# Patient Record
Sex: Female | Born: 2000 | Race: Black or African American | Hispanic: No | Marital: Married | Smoking: Never smoker
Health system: Southern US, Community
[De-identification: ages and names within clinical notes are randomized; demographics above are authoritative.]

---
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
## 2019-05-23 ENCOUNTER — Other Ambulatory Visit: Payer: Self-pay

## 2019-05-23 DIAGNOSIS — Z20822 Contact with and (suspected) exposure to covid-19: Secondary | ICD-10-CM

## 2019-05-25 LAB — NOVEL CORONAVIRUS, NAA: SARS-CoV-2, NAA: NOT DETECTED

## 2019-09-30 IMAGING — CR DG CHEST 2V
1 series · 2 of 2 positions shown · non-contrast
Comparison: None.

CLINICAL DATA: 17-year-old with chest pain.

EXAM:
CHEST - 2 VIEW

[Series 1: dg chest 2 view · 0.14mm/px · 2 of 2 slices shown]
[im 1/2]
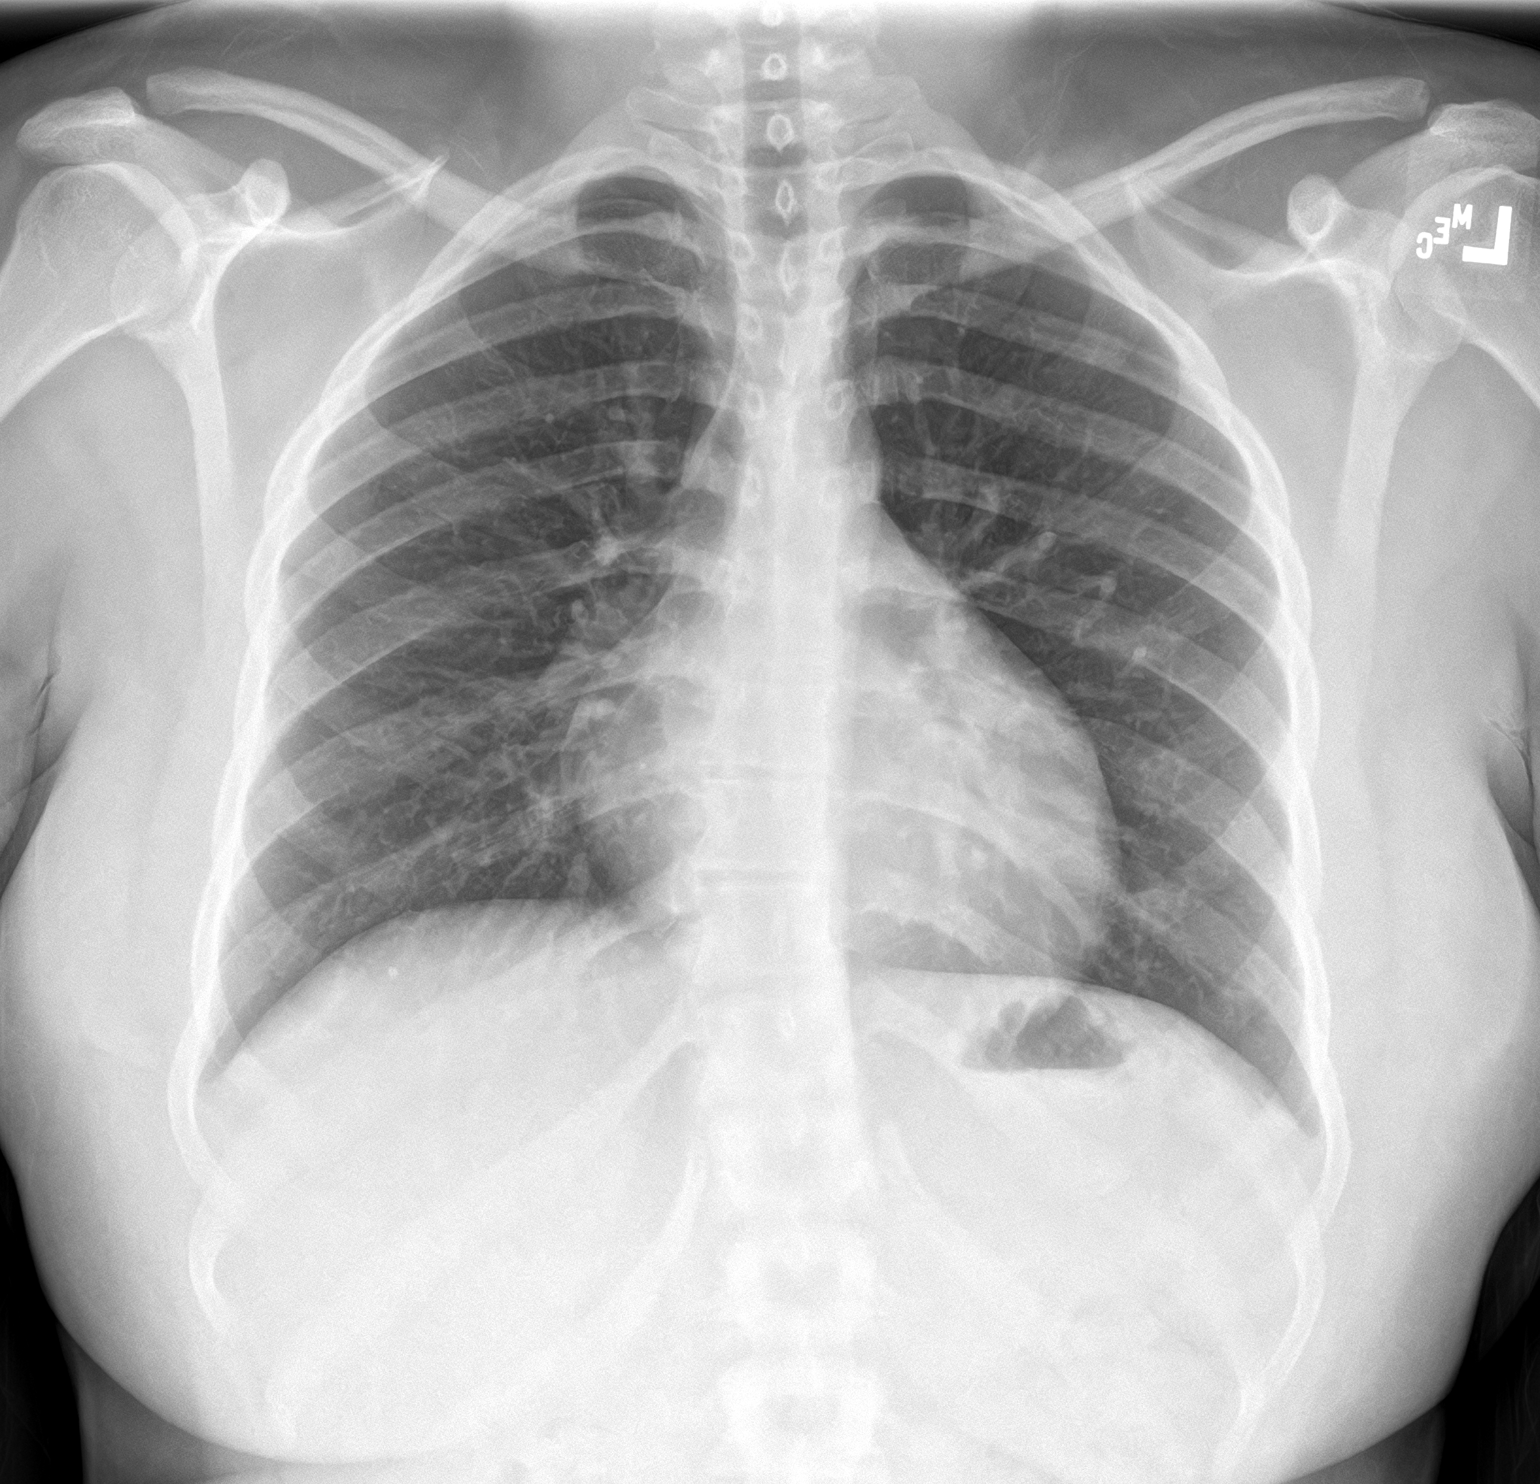
[im 2/2]
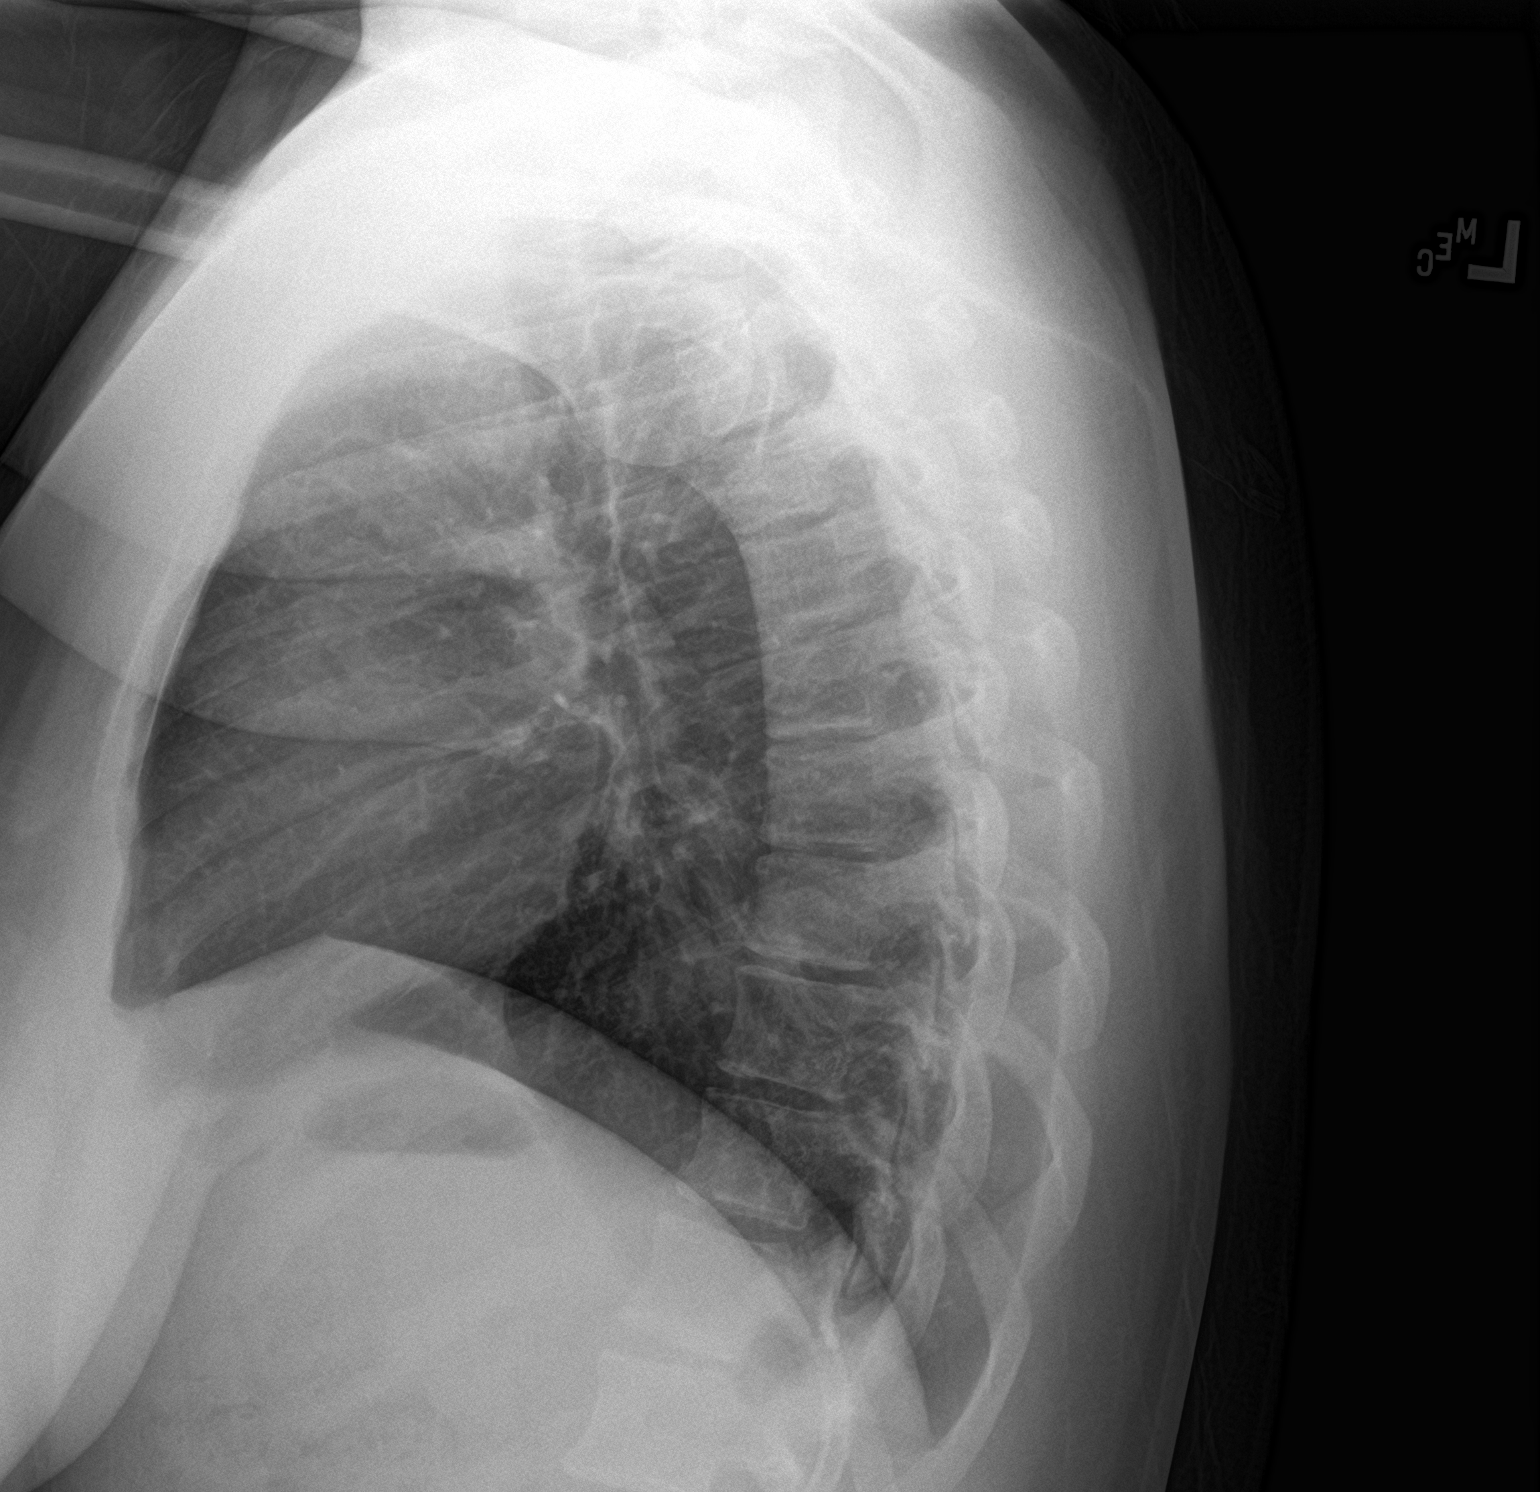

[2 of 2 positions shown; findings below may reference images not displayed]

FINDINGS: Both lungs are clear. Heart and mediastinum are within normal
limits. Negative for a pneumothorax. Trachea is midline. No pleural
effusions. Bone structures are unremarkable.
IMPRESSION: No active cardiopulmonary disease.

## 2020-12-25 ENCOUNTER — Other Ambulatory Visit: Payer: Self-pay

## 2020-12-25 ENCOUNTER — Ambulatory Visit: Payer: Self-pay | Admitting: Physician Assistant

## 2020-12-25 DIAGNOSIS — Z113 Encounter for screening for infections with a predominantly sexual mode of transmission: Secondary | ICD-10-CM

## 2020-12-25 LAB — WET PREP FOR TRICH, YEAST, CLUE
Trichomonas Exam: NEGATIVE
Yeast Exam: NEGATIVE

## 2020-12-26 ENCOUNTER — Encounter: Payer: Self-pay | Admitting: Physician Assistant

## 2020-12-26 NOTE — Progress Notes (Signed)
[facility] Department STI clinic/screening visit  Subjective:  Veronica Manning is a 20 y.o. female being seen today for an STI screening visit. The patient reports they do have symptoms.  Patient reports that they do not desire a pregnancy in the next year.   They reported they are not interested in discussing contraception today.  No LMP recorded.   Patient has the following medical conditions:  There are no problems to display for this patient.   Chief Complaint  Patient presents with  . SEXUALLY TRANSMITTED DISEASE    screening    HPI  Patient reports that she has had a white/pink discharge for 2 days.  Denies other symptoms, chronic conditions, surgeries and regular medicines.  States she has not previously had a HIV test and has not had a pap since she is not yet 20 yr old.   See flowsheet for further details and programmatic requirements.    The following portions of the patient's history were reviewed and updated as appropriate: allergies, current medications, past medical history, past social history, past surgical history and problem list.  Objective:  There were no vitals filed for this visit.  Physical Exam Constitutional:      General: She is not in acute distress.    Appearance: Normal appearance.  HENT:     Head: Normocephalic and atraumatic.     Comments: No nits,lice, or hair loss. No cervical, supraclavicular or axillary adenopathy.    Mouth/Throat:     Mouth: Mucous membranes are moist.     Pharynx: Oropharynx is clear. No oropharyngeal exudate or posterior oropharyngeal erythema.  Eyes:     Conjunctiva/sclera: Conjunctivae normal.  Pulmonary:     Effort: Pulmonary effort is normal.  Abdominal:     Palpations: Abdomen is soft. There is no mass.     Tenderness: There is no abdominal tenderness. There is no guarding or rebound.  Genitourinary:    General: Normal vulva.     Rectum: Normal.     Comments: External genitalia/pubic area without  nits, lice, edema, erythema, lesions and inguinal adenopathy. Vagina with normal mucosa and discharge. Cervix without visible lesions. Uterus firm, mobile, nt, no masses, no CMT, no adnexal tenderness or fullness. Musculoskeletal:     Cervical back: Neck supple. No tenderness.  Skin:    General: Skin is warm and dry.     Findings: No bruising, erythema, lesion or rash.  Neurological:     Mental Status: She is alert and oriented to person, place, and time.  Psychiatric:        Mood and Affect: Mood normal.        Behavior: Behavior normal.        Thought Content: Thought content normal.        Judgment: Judgment normal.      Assessment and Plan:  Veronica Manning is a 20 y.o. female presenting to the [facility] Department for STI screening  1. Screening for STD (sexually transmitted disease) Patient into clinic with symptoms. Reviewed with patient that wet mount is normal and no treatment is indicated today. Rec condoms with all sex. Await test results.  Counseled that RN will call if needs to RTC for treatment once results are back. - WET PREP FOR TRICH, YEAST, CLUE - Chlamydia/Gonorrhea [place] [facility] - HIV [place] [facility] - Syphilis Serology, [place] [facility] - Chlamydia/Gonorrhea [place] [facility]     No follow-ups on file.  No future appointments.  Veronica Holmes, PA

## 2021-01-01 LAB — HM HIV SCREENING LAB: HM HIV Screening: NEGATIVE

## 2021-07-07 ENCOUNTER — Emergency Department
Admission: EM | Admit: 2021-07-07 | Discharge: 2021-07-07 | Disposition: A | Discharge status: Home / Self Care | Payer: Self-pay | Attending: Emergency Medicine | Admitting: Emergency Medicine

## 2021-07-07 ENCOUNTER — Encounter: Payer: Self-pay | Admitting: Emergency Medicine

## 2021-07-07 ENCOUNTER — Other Ambulatory Visit: Payer: Self-pay

## 2021-07-07 DIAGNOSIS — J029 Acute pharyngitis, unspecified: Secondary | ICD-10-CM | POA: Insufficient documentation

## 2021-07-07 LAB — GROUP A STREP BY PCR: Group A Strep by PCR: NOT DETECTED

## 2021-07-07 MED ORDER — MAGIC MOUTHWASH W/LIDOCAINE: 5.0000 mL | Freq: Four times a day (QID) | ORAL | 0 refills | Status: AC

## 2021-07-07 MED ORDER — PREDNISONE 50 MG PO TABS: 50.0000 mg | ORAL_TABLET | Freq: Every day | ORAL | 0 refills | Status: AC

## 2021-07-07 NOTE — ED Provider Notes (Signed)
[facility] Emergency Department Provider Note  ____________________________________________  Time seen: Approximately 3:27 PM  I have reviewed the triage vital signs and the nursing notes.   HISTORY  Chief Complaint Sore Throat    HPI Veronica Manning is a 20 y.o. female who presents the emergency department complaining of sore throat.  She says symptoms since 5 days.  No associated fever, nasal congestion, cough.  Patient states that she feels like the right side is worse than the left in regards to pain but in the mirror both tonsils appear nearly symmetrical.  She has noticed some white spots on her tonsils.  She been using salt water gargle with no relief of symptoms.  No other complaints at this time.       History reviewed. No pertinent past medical history.  There are no problems to display for this patient.   History reviewed. No pertinent surgical history.  Prior to Admission medications   Medication Sig Start Date End Date Taking? Authorizing Provider  magic mouthwash w/lidocaine SOLN Take 5 mLs by mouth 4 (four) times daily. 07/07/21  Yes Lanina Larranaga, Delorise Royals, PA-C  predniSONE (DELTASONE) 50 MG tablet Take 1 tablet (50 mg total) by mouth daily with breakfast. 07/07/21  Yes Ninel Abdella, Delorise Royals, PA-C  sulfamethoxazole-trimethoprim (BACTRIM DS,SEPTRA DS) 800-160 MG tablet Take 1 tablet by mouth 2 (two) times daily. Patient not taking: No sig reported 01/11/18   Faythe Ghee, PA-C    Allergies Patient has no known allergies.  No family history on file.  Social History Social History   Tobacco Use   Smoking status: Never   Smokeless tobacco: Never  Substance Use Topics   Alcohol use: No   Drug use: Never     Review of Systems  Constitutional: No fever/chills Eyes: No visual changes. No discharge ENT: Positive for sore throat Cardiovascular: no chest pain. Respiratory: no cough. No SOB. Gastrointestinal: No abdominal pain.  No  nausea, no vomiting.  No diarrhea.  No constipation. Musculoskeletal: Negative for musculoskeletal pain. Skin: Negative for rash, abrasions, lacerations, ecchymosis. Neurological: Negative for headaches, focal weakness or numbness.  10 System ROS otherwise negative.  ____________________________________________   PHYSICAL EXAM:  VITAL SIGNS: ED Triage Vitals  Enc Vitals Group     BP 07/07/21 1445 (!) 124/93     Pulse Rate 07/07/21 1445 87     Resp 07/07/21 1445 14     Temp 07/07/21 1445 98.7 F (37.1 C)     Temp Source 07/07/21 1445 Oral     SpO2 07/07/21 1445 100 %     Weight 07/07/21 1446 180 lb (81.6 kg)     Height 07/07/21 1446 5' (1.524 m)     Head Circumference --      Peak Flow --      Pain Score 07/07/21 1446 9     Pain Loc --      Pain Edu? --      Excl. in GC? --      Constitutional: Alert and oriented. Well appearing and in no acute distress. Eyes: Conjunctivae are normal. PERRL. EOMI. Head: Atraumatic. ENT:      Ears:       Nose: No congestion/rhinnorhea.      Mouth/Throat: Mucous membranes are moist.  Oropharynx with symmetrically enlarged tonsils bilaterally.  Patient does have exudates.  Uvula is midline.  No other oropharyngeal erythema or edema.  Palpation along the submandibular space and anterior neck reveals no erythema, edema, tenderness. Neck: No  stridor.   Hematological/Lymphatic/Immunilogical: No cervical lymphadenopathy. Cardiovascular: Normal rate, regular rhythm. Normal S1 and S2.  Good peripheral circulation. Respiratory: Normal respiratory effort without tachypnea or retractions. Lungs CTAB. Good air entry to the bases with no decreased or absent breath sounds. Musculoskeletal: Full range of motion to all extremities. No gross deformities appreciated. Neurologic:  Normal speech and language. No gross focal neurologic deficits are appreciated.  Skin:  Skin is warm, dry and intact. No rash noted. Psychiatric: Mood and affect are normal. Speech  and behavior are normal. Patient exhibits appropriate insight and judgement.   ____________________________________________   LABS (all labs ordered are listed, but only abnormal results are displayed)  Labs Reviewed  GROUP A STREP BY PCR   ____________________________________________  EKG   ____________________________________________  RADIOLOGY   No results found.  ____________________________________________    PROCEDURES  Procedure(s) performed:    Procedures    Medications - No data to display   ____________________________________________   INITIAL IMPRESSION / ASSESSMENT AND PLAN / ED COURSE  Pertinent labs & imaging results that were available during my care of the patient were reviewed by me and considered in my medical decision making (see chart for details).  Review of the [place] CSRS was performed in accordance of the [facility] prior to dispensing any controlled drugs.           Patient's diagnosis is consistent with viral pharyngitis.  Patient presents with enlarged tonsils, painful tonsils x5 days.  No fevers, no cough, no nasal congestion, no difficulty breathing or swallowing.  Negative strep test.  Findings are consistent with viral pharyngitis.  Tonsils are enlarged symmetrically, uvula is midline, no submandibular or anterior neck erythema or edema.  No indication for further work-up at this time.  Patient will have prescription for prednisone and Magic mouthwash for symptom relief.  Follow-up primary care as needed.  Return precautions discussed with the patient..Patient is given ED precautions to return to the ED for any worsening or new symptoms.     ____________________________________________  FINAL CLINICAL IMPRESSION(S) / ED DIAGNOSES  Final diagnoses:  Viral pharyngitis      NEW MEDICATIONS STARTED DURING THIS VISIT:  ED Discharge Orders          Ordered    predniSONE (DELTASONE) 50 MG tablet  Daily with breakfast         07/07/21 1637    magic mouthwash w/lidocaine SOLN  4 times daily       Note to Pharmacy: Dispense in a 1/1/1 ratio. Use lidocaine, diphenhydramine, prednisolone   07/07/21 1637                This chart was dictated using voice recognition software/Dragon. Despite best efforts to proofread, errors can occur which can change the meaning. Any change was purely unintentional.    Racheal Patches, PA-C 07/07/21 1643    Merwyn Katos, MD 07/08/21 1549

## 2021-07-07 NOTE — ED Provider Notes (Signed)
Emergency Medicine Provider Triage Evaluation Note  Veronica Manning, a 20 y.o. female  was evaluated in triage.  Pt complains of sore throat for few days denies any fevers, chills, or sweats.  Patient been able to eat and drink without significant difficulty.  She does endorse a weak voice.  Review of Systems  Positive: tonsillitis Negative: FCS  Physical Exam  BP (!) 124/93   Pulse 87   Temp 98.7 F (37.1 C) (Oral)   Resp 14   Ht 5' (1.524 m)   Wt 81.6 kg   LMP 06/13/2021   SpO2 100%   BMI 35.15 kg/m  Gen:   Awake, no distress  NAD Resp:  Normal effort CTA MSK:   Moves extremities without difficulty  Other:  ENT: 2+ tonsils without mild erythema, stones noted.   Medical Decision Making  Medically screening exam initiated at 2:51 PM.  Appropriate orders placed.  Veronica Manning was informed that the remainder of the evaluation will be completed by another provider, this initial triage assessment does not replace that evaluation, and the importance of remaining in the ED until their evaluation is complete.  Patient with 3 days of sore throat and tonsillitis without associated fevers, chills, or sweats.   Lissa Hoard, PA-C 07/07/21 1452    Jene Every, MD 07/07/21 [phone]

## 2021-07-07 NOTE — ED Triage Notes (Signed)
Says sore throat for few days.  No fever.

## 2023-11-08 ENCOUNTER — Ambulatory Visit: Payer: Self-pay | Admitting: Family Medicine

## 2023-11-08 DIAGNOSIS — Z124 Encounter for screening for malignant neoplasm of cervix: Secondary | ICD-10-CM

## 2023-11-08 DIAGNOSIS — Z113 Encounter for screening for infections with a predominantly sexual mode of transmission: Secondary | ICD-10-CM

## 2023-11-08 DIAGNOSIS — A749 Chlamydial infection, unspecified: Secondary | ICD-10-CM | POA: Insufficient documentation

## 2023-11-08 LAB — WET PREP FOR TRICH, YEAST, CLUE
Trichomonas Exam: NEGATIVE
Yeast Exam: NEGATIVE

## 2023-11-08 LAB — HM HIV SCREENING LAB: HM HIV Screening: NEGATIVE

## 2023-11-08 MED ORDER — DOXYCYCLINE HYCLATE 100 MG PO TABS: 100.0000 mg | ORAL_TABLET | Freq: Two times a day (BID) | ORAL | Status: AC

## 2023-11-08 NOTE — Progress Notes (Signed)
 See family planning encounter for today's visit note.   Fayette Pho, MD 11/08/23  11:20 AM

## 2023-11-08 NOTE — Progress Notes (Signed)
 [facility] HEALTH DEPARTMENT [facility] [address], [address] [place] [address] Main phone: [phone]  Family Planning Visit - Initial Visit  Subjective:  Veronica Manning is a 23 y.o.  No obstetric history on file.   being seen today for an initial annual visit and to discuss reproductive life planning.  The patient is currently using female condom for pregnancy prevention. Patient does not want a pregnancy in the next year.   Patient reports they are looking for a method with the following characteristics:  Ready when they are  Patient has the following medical conditions: Patient Active Problem List   Diagnosis Date Noted   Positive Chlamyida test 11/08/2023   Cervical cancer screening 11/08/2023   Chief Complaint  Patient presents with   SEXUALLY TRANSMITTED DISEASE    Tested pos. @ [employer]. Lower belly pain. Burning and itching around vagina.   HPI Patient reports she started experiencing vaginal burning, dysuria, vulvovaginal pruritus, lower abdominal pain, and intermittent left sided flank pain in early February. Did a self-test STI kit at home; resulted released on 11/04/23 - showed positive for chlamydia and negative for gonorrhea.   Denies fever, chills, dyspareunia Last sex 3 months ago, LMP 10/21/23  Smokes [employer] - few years  ROS: See above HPI  Diabetes screening This patient is 23 y.o. with a BMI of There is no height or weight on file to calculate BMI..  Is patient eligible for diabetes screening (age >35 and BMI >25)?  no  Was Hgb A1c ordered? not applicable  STI screening Patient reports 2 of partners in last year.  Does this patient desire STI screening?  Yes  Hepatitis C screening Has patient been screened once for HCV in the past?  No  No results found for: "HCVAB"  Does the patient meet criteria for HCV testing? No   Hepatitis B screening Does the patient meet criteria for HBV testing? No  Cervical  Cancer Screening  No Cervical Cancer Screening results to display.  Health Maintenance Due  Topic Date Due   CHLAMYDIA SCREENING  Never done   HPV VACCINES (1 - 3-dose series) Never done   Hepatitis C Screening  Never done   DTaP/Tdap/Td (1 - Tdap) Never done   Cervical Cancer Screening (Pap smear)  Never done   INFLUENZA VACCINE  Never done   COVID-19 Vaccine (1 - 2024-25 season) Never done   The following portions of the patient's history were reviewed and updated as appropriate: allergies, current medications, past family history, past medical history, past social history, past surgical history and problem list. Problem list updated.  See flowsheet for other program required questions.  Objective:  There were no vitals filed for this visit.  Physical Exam Vitals and nursing note reviewed. Exam conducted with a chaperone present Ike Bene, CNA).  Constitutional:      General: She is not in acute distress.    Appearance: Normal appearance. She is normal weight. She is not toxic-appearing or diaphoretic.  HENT:     Head: Normocephalic and atraumatic.     Nose: Nose normal. No congestion.     Mouth/Throat:     Mouth: Mucous membranes are moist.     Pharynx: Oropharynx is clear. No oropharyngeal exudate or posterior oropharyngeal erythema.  Eyes:     General: No scleral icterus.       Right eye: No discharge.        Left eye: No discharge.  Conjunctiva/sclera: Conjunctivae normal.  Pulmonary:     Effort: Pulmonary effort is normal.  Abdominal:     General: Abdomen is flat.     Palpations: Abdomen is soft. There is no mass.     Tenderness: There is no abdominal tenderness. There is no guarding or rebound.  Genitourinary:    General: Normal vulva.     Exam position: Lithotomy position.     Pubic Area: No rash or pubic lice.      Tanner stage (genital): 5.     Labia:        Right: No rash, tenderness, lesion or injury.        Left: No rash, tenderness, lesion or  injury.      Vagina: Normal. No vaginal discharge, erythema, tenderness, bleeding or lesions.     Cervix: Normal. No cervical motion tenderness, discharge, friability, lesion or erythema.     Uterus: Normal. Not enlarged and not tender.      Adnexa: Right adnexa normal.       Right: No tenderness or fullness.         Left: Tenderness (Mild tenderness to palpation - verbalized pain only) present. No fullness.       Comments: pH = 4 Musculoskeletal:        General: Normal range of motion.     Cervical back: Neck supple. No rigidity or tenderness.  Lymphadenopathy:     Head:     Right side of head: No preauricular or posterior auricular adenopathy.     Left side of head: No preauricular or posterior auricular adenopathy.     Cervical: No cervical adenopathy.     Upper Body:     Right upper body: No supraclavicular, axillary or epitrochlear adenopathy.     Left upper body: No supraclavicular, axillary or epitrochlear adenopathy.  Skin:    General: Skin is warm and dry.     Capillary Refill: Capillary refill takes less than 2 seconds.     Coloration: Skin is not jaundiced.     Findings: No bruising, erythema, lesion or rash.  Neurological:     Mental Status: She is alert and oriented to person, place, and time.  Psychiatric:        Mood and Affect: Mood normal.        Behavior: Behavior normal.    Assessment and Plan:  [name] is a 23 y.o. female presenting to the [facility] Department for an initial annual wellness/contraceptive visit  Contraception counseling: Reviewed options based on patient desire and reproductive life plan. Patient is interested in Female Condom. This was provided to the patient today.   Risks, benefits, and typical effectiveness rates were reviewed.  Questions were answered.  Written information was also given to the patient to review.    The patient will follow up in  1 years for surveillance.  The patient was told to call with any further  questions, or with any concerns about this method of contraception.  Emphasized use of condoms 100% of the time for STI prevention.  Educated on ECP and assessed for need of ECP. Patient does not qualify as she has not had sex since her LMP.    Positive Chlamyida test Assessment & Plan: From home self-test kit. Patient showed me the results portal with positive chlamydia result. Will treat based on this because of concurrent symptoms. Will collect confirmatory testing today. Initially concerned for PID based on symptoms and positive chlamydia test, however physical exam reassuring.  Discussed with patient low probability of PID and that we will proceed with chlamydia treatment only today. Patient amenable, all questions answered.   Orders: -     Doxycycline Hyclate; Take 1 tablet (100 mg total) by mouth 2 (two) times daily for 7 days.  Screening examination for venereal disease -     WET PREP FOR TRICH, YEAST, CLUE -     Chlamydia/Gonorrhea [place] [facility] -     Syphilis Serology, [facility] -     HIV  LAB -     Gonococcus culture  Cervical cancer screening Assessment & Plan: First ever PAP smear obtained today without difficulty. Patient tolerated well. Will follow results.   Orders: -     IGP, rfx Aptima HPV ASCU   No follow-ups on file.  No future appointments.  Clydene Fake, MD

## 2023-11-08 NOTE — Assessment & Plan Note (Signed)
 First ever PAP smear obtained today without difficulty. Patient tolerated well. Will follow results.

## 2023-11-08 NOTE — Patient Instructions (Signed)
 STI screening - Today we obtained a vaginal swab to screen for gonorrhea, chlamydia, and trichomonas - We also obtained a blood sample to screen for HIV and syphilis - If the results are normal, I will send you a letter or MyChart message. If the results are abnormal, I will give you a call.    Estimated time frame for results collected at the [facility] Department: Same day Trichomonas Yeast BV (bacterial vaginosis)  Within 1-2 weeks Gonorrhea Chlamydia  Within 2-3 weeks HIV Syphilis Hepatitis B Hepatitis C    PAP Smear Today we performed a PAP smear to screen for cervical cancer. The results should be back in 1-2 weeks.  Once we have the results we can determine when your next screening should be.

## 2023-11-08 NOTE — Progress Notes (Signed)
 Pt is here for FP visit today,Wet prep reviewed with patient. The patient was dispensed Doxycycline 100 mg 2x/day for 7 days today. I provided counseling today regarding the medication. We discussed the medication, the side effects and when to call clinic. Condoms and brochure given. Patient given the opportunity to ask questions for any clarification. Sonda Primes, RN.

## 2023-11-08 NOTE — Assessment & Plan Note (Signed)
 From home self-test kit. Patient showed me the results portal with positive chlamydia result. Will treat based on this because of concurrent symptoms. Will collect confirmatory testing today. Initially concerned for PID based on symptoms and positive chlamydia test, however physical exam reassuring. Discussed with patient low probability of PID and that we will proceed with chlamydia treatment only today. Patient amenable, all questions answered.

## 2023-11-14 LAB — GONOCOCCUS CULTURE

## 2023-11-16 ENCOUNTER — Telehealth: Payer: Self-pay

## 2023-11-16 NOTE — Telephone Encounter (Signed)
 Call pt re positive Chlamydia result from 11/08/23 vaginal specimen. Tx received during 11/08/23 visit due to at home TR shared with provider and symptoms.

## 2023-11-17 NOTE — Telephone Encounter (Signed)
 Received return call from pt and pt confirmed password.  Counseled pt re + CT results and negative GC result from 11/08/23 vaginal specimen.  Tx already received CT tx at 11/08/23 visit. Pt stated she is doing fine with the medication. Counseled to RTC in ~ 3 months for TOC.

## 2023-11-17 NOTE — Telephone Encounter (Signed)
 Phone call to pt at [phone]. Left message that RN wit [facility] is calling re TR. Please call Derricka Mertz at [phone].

## 2023-11-23 ENCOUNTER — Encounter: Payer: Self-pay | Admitting: Family Medicine

## 2023-11-23 LAB — IGP, RFX APTIMA HPV ASCU: PAP Smear Comment: 0

## 2023-12-28 ENCOUNTER — Telehealth: Payer: Self-pay | Admitting: Physician Assistant

## 2023-12-28 DIAGNOSIS — B3731 Acute candidiasis of vulva and vagina: Secondary | ICD-10-CM

## 2023-12-28 MED ORDER — FLUCONAZOLE 150 MG PO TABS: 150.0000 mg | ORAL_TABLET | Freq: Every day | ORAL | 0 refills | Status: AC

## 2023-12-28 NOTE — Progress Notes (Signed)

## 2024-01-04 ENCOUNTER — Ambulatory Visit: Payer: Self-pay

## 2024-01-04 ENCOUNTER — Encounter: Payer: Self-pay | Admitting: Nurse Practitioner

## 2024-01-04 DIAGNOSIS — B3731 Acute candidiasis of vulva and vagina: Secondary | ICD-10-CM

## 2024-01-04 DIAGNOSIS — Z113 Encounter for screening for infections with a predominantly sexual mode of transmission: Secondary | ICD-10-CM

## 2024-01-04 LAB — HEPATITIS B SURFACE ANTIGEN: Hepatitis B Surface Ag: NONREACTIVE

## 2024-01-04 LAB — HM HIV SCREENING LAB: HM HIV Screening: NEGATIVE

## 2024-01-04 LAB — WET PREP FOR TRICH, YEAST, CLUE
Clue Cell Exam: NEGATIVE
Trichomonas Exam: NEGATIVE
Yeast Exam: NEGATIVE

## 2024-01-04 LAB — HM HEPATITIS C SCREENING LAB: HM Hepatitis Screen: NEGATIVE

## 2024-01-04 MED ORDER — CLOTRIMAZOLE 1 % VA CREA: 1.0000 | TOPICAL_CREAM | Freq: Every day | VAGINAL | Status: AC

## 2024-01-04 NOTE — Progress Notes (Signed)
 [facility] Department STI clinic [address] [place] [address] Main phone: [phone]  STI screening visit  Subjective:  Veronica Manning is a 23 y.o. female being seen today for an STI screening visit. The patient reports they do have symptoms.  Patient reports that they do not desire a pregnancy in the next year.   They reported they are not interested in discussing contraception today.    Patient's last menstrual period was 11/16/2023 (approximate).  Patient has the following medical conditions:  Patient Active Problem List   Diagnosis Date Noted   Positive Chlamyida test 11/08/2023   Cervical cancer screening 11/08/2023   Chief Complaint  Patient presents with   SEXUALLY TRANSMITTED DISEASE    HPI Patient is a pleasant 23 y.o. female who presents to the office today requesting symptomatic STI testing. Patient reports symptoms include vaginal itching for 1 week.   In reviewing chart, patient had a visit 7 days ago via telehealth and was prescribed fluconazole tablet. Unknown if patient took medication as she did not report this to me and it was subsequently found in electronic chart review.   Patient indicates 1 female partner in the last 2 months but has had same sex partners in the past. She reports practicing vaginal and oral sex and uses condoms sometimes. Patient indicates STI history of chlamydia in March (2 months ago). Patient reports last sex was February 2025 (3 months ago). She indicates no use of a contraception method other than condoms sometimes.  Patient indicates LMP was sometime in early April, but not exactly sure of the date. She states periods are becoming more irregular.   See flowsheet for further details and programmatic requirements Hyperlink available at the top of the signed note in blue.  Flow sheet content below:  Pregnancy Intention Screening Does the patient want to become pregnant in the next year?:  No Does the patient's partner want to become pregnant in the next year?: No Would the patient like to discuss contraceptive options today?: No Reason For STD Screen STD Screening: Has symptoms Have you ever had an STD?: Yes History of Antibiotic use in the past 2 weeks?: No STD Symptoms Denies all: No Genital Itching: Yes Genital Itch s/s: Started 1 week ago. Lower abdominal pain: No Discharge: No Dysuria: No Genital ulcer / lesion: No Rash: No Vaginal irritation: Yes Vaginal irritation s/s: See itching. Oral / Other skin ulcer: No Pain with sex: No Sore Throat: No Visual Changes: No Vaginal Bleeding: No Other (Describe in Comments): No Risk Factors for Hep B Household, sexual, or needle sharing contact of a person infected with Hep B: No Sexual contact with a person who uses drugs not as prescribed?: No Currently or Ever used drugs not as prescribed: No HIV Positive: No PRep Patient: No Men who have sex with men: N/A Have Hepatitis C: No History of Incarceration: No History of Homeslessness?: No Anal sex following anal drug use?: No Risk Factors for Hep C Currently using drugs not as prescribed: No Sexual partner(s) currently using drugs as not prescribed: No History of drug use: Yes HIV Positive: No People with a history of incarceration: No People born between the years of [birth year] and 1965: No Hepatitis Counseling Hep C Counseling: Counseled patient about increased risk of Hep C and recommendation for testing, Patient accepts testing for Hep C today Abuse History Has patient ever been abused physically?: No Has patient ever been abused sexually?: No Does patient feel they  have a problem with Anxiety?: No Does patient feel they have a problem with Depression?: No Referral to Behavioral Health: N/A Counseling Patient counseled to use condoms with all sex: Condoms given RTC in 2-3 weeks for test results: Yes Clinic will call if test results abnormal before test  result appt.: Yes BCM given today through [facility] clinic: No Immunizations: Declined Test results given to patient Patient counseled to use condoms with all sex: Condoms given Contraception Wrap Up Current Method: Female Condom End Method: Female Condom Contraception Counseling Provided: No How was the end contraceptive method provided?: Provided on site   Screening for MPX risk: Does the patient have an unexplained rash? No Is the patient MSM? No Does the patient endorse multiple sex partners or anonymous sex partners? No Did the patient have close or sexual contact with a person diagnosed with MPX? No Has the patient traveled outside the [place]  where MPX is endemic? No Is there a high clinical suspicion for MPX-- evidenced by one of the following No  -Unlikely to be chickenpox  -Lymphadenopathy  -Rash that present in same phase of evolution on any given body part  Screenings: Last HIV test per patient/review of record was  Lab Results  Component Value Date   HMHIVSCREEN Negative - Validated 11/08/2023   No results found for: "HIV"   Last HEPC test per patient/review of record was No results found for: "HMHEPCSCREEN" No components found for: "HEPC"   Last HEPB test per patient/review of record was No components found for: "HMHEPBSCREEN"   Patient reports last pap was:   Lab Results  Component Value Date   SPECADGYN Comment 11/08/2023   Result Date Procedure Results Follow-ups  11/08/2023 IGP, rfx Aptima HPV ASCU DIAGNOSIS:: Comment (A) PAP Reflex: Comment Specimen adequacy:: Comment Clinician Provided ICD10: Comment Performed by:: Comment Electronically signed by:: Comment PAP Smear Comment: . PATHOLOGIST PROVIDED ICD10:: Comment Note:: Comment Test Methodology: Comment     Immunization history:   There is no immunization history on file for this patient.  The following portions of the patient's history were reviewed and updated as appropriate: allergies, current medications,  past medical history, past social history, past surgical history and problem list.  Objective:   There were no vitals filed for this visit.  Physical Exam Nursing note reviewed.  Constitutional:      Appearance: Normal appearance.  HENT:     Head: Normocephalic.     Salivary Glands: Right salivary gland is not diffusely enlarged or tender. Left salivary gland is not diffusely enlarged or tender.     Mouth/Throat:     Lips: Pink. No lesions.     Mouth: Mucous membranes are moist.     Tongue: No lesions. Tongue does not deviate from midline.     Pharynx: Oropharynx is clear. Uvula midline.     Tonsils: No tonsillar exudate.  Eyes:     General:        Right eye: No discharge.        Left eye: No discharge.  Pulmonary:     Effort: Pulmonary effort is normal.  Genitourinary:    General: Normal vulva.     Exam position: Lithotomy position.     Pubic Area: No rash or pubic lice.      Tanner stage (genital): 5.     Labia:        Right: Injury present. No rash, tenderness or lesion.        Left: Injury present. No rash, tenderness or  lesion.      Vagina: Vaginal discharge present. No erythema, tenderness, bleeding or lesions.     Cervix: Discharge present. No cervical motion tenderness, friability, lesion, erythema, cervical bleeding or eversion.     Uterus: Normal.      Adnexa: Right adnexa normal and left adnexa normal.       Comments: pH<4.5 Erythematous area to lower external vaginal labia depicted above indicative of excoriation from patient reported vaginal itching.  On exam, white-yellow, thick, non-odorous vaginal discharge present.  Lymphadenopathy:     Head:     Right side of head: No submental, submandibular, tonsillar, preauricular or posterior auricular adenopathy.     Left side of head: No submental, submandibular, tonsillar, preauricular or posterior auricular adenopathy.     Cervical: No cervical adenopathy.     Right cervical: No superficial or posterior  cervical adenopathy.    Left cervical: No superficial or posterior cervical adenopathy.     Upper Body:     Right upper body: No supraclavicular or axillary adenopathy.     Left upper body: No supraclavicular or axillary adenopathy.     Lower Body: No right inguinal adenopathy. No left inguinal adenopathy.  Skin:    General: Skin is warm and dry.     Findings: No lesion or rash.     Comments: Skin tone appropriate for ethnicity.   Neurological:     Mental Status: She is alert and oriented to person, place, and time.  Psychiatric:        Attention and Perception: Attention and perception normal.        Mood and Affect: Mood and affect normal.        Speech: Speech normal.        Behavior: Behavior normal. Behavior is cooperative.        Thought Content: Thought content normal.      Assessment and Plan:  Veronica Manning is a 23 y.o. female presenting to the [facility] Department for STI screening  1. Screening for venereal disease (Primary)  - Chlamydia/Gonorrhea [place] [facility] - Gonococcus culture - HBV Antigen/Antibody [facility] - HIV/HCV [place] [facility] - WET PREP FOR TRICH, YEAST, CLUE - Syphilis Serology, [place] [facility]  2. Vaginal candidiasis With patient reported symptoms and vaginal discharge with external vaginal excoriation present from itching will prescribe anti-fungal vaginal yeast cream today. Dispensed by RN.  - clotrimazole (CLOTRIMAZOLE-7) 1 % vaginal cream; Place 1 Applicatorful vaginally at bedtime for 7 days.    Patient accepted the following screenings: oral GC culture, vaginal CT/GC swabs, vaginal wet prep, HIV, RPR, Hep B, and Hep C Patient meets criteria for HepB screening? Yes. Ordered? yes Patient meets criteria for HepC screening? Yes. Ordered? yes  Treat wet prep per standing order Discussed time line for State Lab results and that patient will be called with positive results and encouraged patient to call if she had not heard in 2  weeks.  Counseled to return or seek care for continued or worsening symptoms Recommended repeat testing in 3 months with positive results. Recommended condom use with all sex for STI prevention.   Patient is currently using no method to prevent pregnancy.    Return if symptoms worsen or fail to improve.  No future appointments.  Merleen Stare, NP

## 2024-01-04 NOTE — Progress Notes (Signed)
 [employer] results negative. Per verbal order of J. Idol, NP dispense vaginal Clotrimazole cream for symptoms of Yeast. Eden Goodpasture, RN

## 2024-01-05 ENCOUNTER — Ambulatory Visit: Payer: Self-pay

## 2024-01-09 LAB — GONOCOCCUS CULTURE

## 2024-06-22 ENCOUNTER — Encounter: Payer: Self-pay | Admitting: Family Medicine

## 2024-06-22 ENCOUNTER — Ambulatory Visit: Payer: Self-pay | Admitting: Family Medicine

## 2024-06-22 DIAGNOSIS — Z113 Encounter for screening for infections with a predominantly sexual mode of transmission: Secondary | ICD-10-CM

## 2024-06-22 DIAGNOSIS — N898 Other specified noninflammatory disorders of vagina: Secondary | ICD-10-CM

## 2024-06-22 LAB — WET PREP FOR TRICH, YEAST, CLUE
Clue Cell Exam: NEGATIVE
Trichomonas Exam: NEGATIVE
Yeast Exam: NEGATIVE

## 2024-06-22 LAB — HM HIV SCREENING LAB: HM HIV Screening: NEGATIVE

## 2024-06-22 MED ORDER — CLOTRIMAZOLE-BETAMETHASONE 1-0.05 % EX CREA: 1.0000 | TOPICAL_CREAM | Freq: Every day | CUTANEOUS | 0 refills | Status: AC

## 2024-06-22 NOTE — Progress Notes (Signed)
 Pt is here for STD screening. Wet prep results reviewed with patient. Pt was prescribed Clortrimazole 1% Vaginal cream to be used before bedtime for 7 days. Pt has been advised that prescription has been sent to the pharmacy for pick up. Opportunity given to patient to ask questions for any clarifications. Qestions answered.  Condoms given. Kwadwo Zelphia Glover,RN.

## 2024-06-22 NOTE — Progress Notes (Signed)
 [facility] Department STI clinic [address] [place] [address] Main phone: [phone]  STI screening visit  Subjective:  Veronica Manning is a 23 y.o. female being seen today for an STI screening visit. The patient reports they do have symptoms.    Patient reports they are not pregnant . They do not desire a pregnancy in the next year.They reported they are not interested in discussing contraception today.    Patient's last menstrual period was 06/05/2024.  Patient has the following medical conditions:  Patient Active Problem List   Diagnosis Date Noted   Positive Chlamyida test 11/08/2023   Cervical cancer screening 11/08/2023   Chief Complaint  Patient presents with   SEXUALLY TRANSMITTED DISEASE    Pt is here STD screening and has symptoms    HPI Patient reports itching on outside of vagina  that has been occurring for a week. Pt states she has shaved in her vaginal area with a razor a month ago. Pt denies any vaginal bleeding, odor, or discharge.  Does the patient using douching products? No  See flowsheet for further details and programmatic requirements Hyperlink available at the top of the signed note in blue.  Flow sheet content below:  Pregnancy Intention Screening Does the patient want to become pregnant in the next year?: No Does the patient's partner want to become pregnant in the next year?: No Would the patient like to discuss contraceptive options today?: No All Patients Anyone smoke around pt and/or pt's children?: No Anyone smoke inside pt's house?: Yes Anyone smoke inside car?: No Anyone smoke inside the workplace?: No Reason For STD Screen STD Screening: Has symptoms Have you ever had an STD?: Yes History of Antibiotic use in the past 2 weeks?: No STD Symptoms Denies all: No Genital Itching:  (vaginal itching all over.) Risk Factors for Hep B Sexual contact with a person who uses drugs not as  prescribed?: No Currently or Ever used drugs not as prescribed: No HIV Positive: No PRep Patient: No Men who have sex with men: No Have Hepatitis C: No History of Incarceration: No History of Homeslessness?: No Anal sex following anal drug use?: No Risk Factors for Hep C Currently using drugs not as prescribed: No Sexual partner(s) currently using drugs as not prescribed: No History of drug use: No HIV Positive: No People with a history of incarceration: No People born between the years of 19 and [birth year]: No Advise Advised client to quit or stay quit. : Yes Assess # of cigarettes per day:  (Pt smokes MJ) Is patient ready to quit in next 30 days?:  (Pt Unsure, gave pt quitline information) Abuse History Has patient ever been abused physically?: No Has patient ever been abused sexually?: No Does patient feel they have a problem with Anxiety?: No Does patient feel they have a problem with Depression?: No Referral to Behavioral Health: N/A   Screening for MPX risk:  Unexplained rash?  No   MSM?  No   Multiple or anonymous sex partners?  No   Any close or sexual contact with a person  diagnosed with MPX?  No   Any outside the [place]  where MPX is endemic?  No   High clinical suspicion for MPX?    -Unlikely to be chickenpox    -Lymphadenopathy    -Rash that presents in same phase of       evolution on any given body part  No   Screenings: Last HIV test  per patient/review of record was  Lab Results  Component Value Date   HMHIVSCREEN Negative - Validated 01/04/2024   No results found for: HIV   Last HEPC test per patient/review of record was  Lab Results  Component Value Date   HMHEPCSCREEN Negative-Validated 01/04/2024   No components found for: HEPC   Last HEPB test per patient/review of record was No components found for: HMHEPBSCREEN   Patient reports last pap was:   Lab Results  Component Value Date   SPECADGYN Comment 11/08/2023   Result Date Procedure  Results Follow-ups  11/08/2023 IGP, rfx Aptima HPV ASCU DIAGNOSIS:: Comment (A) Specimen adequacy:: Comment Clinician Provided ICD10: Comment Performed by:: Comment Electronically signed by:: Comment PAP Smear Comment: . PATHOLOGIST PROVIDED ICD10:: Comment Note:: Comment Test Methodology: Comment PAP Reflex: Comment     Immunization history:   There is no immunization history on file for this patient.  The following portions of the patient's history were reviewed and updated as appropriate: allergies, current medications, past medical history, past social history, past surgical history and problem list.  Objective:  There were no vitals filed for this visit.  Physical Exam Vitals and nursing note reviewed. Exam conducted with a chaperone present Kayleen Helling, MD).  Constitutional:      Appearance: Normal appearance.  HENT:     Head: Normocephalic and atraumatic.     Mouth/Throat:     Mouth: Mucous membranes are moist.     Pharynx: Oropharynx is clear. No oropharyngeal exudate or posterior oropharyngeal erythema.  Eyes:     General:        Right eye: No discharge.        Left eye: No discharge.     Conjunctiva/sclera: Conjunctivae normal.  Pulmonary:     Effort: Pulmonary effort is normal.  Abdominal:     General: Abdomen is flat.  Genitourinary:    General: Normal vulva.     Exam position: Lithotomy position.     Pubic Area: No rash or pubic lice.      Labia:        Right: No rash, tenderness or lesion.        Left: No rash, tenderness or lesion.      Vagina: Normal. No erythema, bleeding or lesions.     Comments: pH = <4.5 Musculoskeletal:        General: Normal range of motion.     Cervical back: Normal range of motion.  Lymphadenopathy:     Head:     Right side of head: No preauricular or posterior auricular adenopathy.     Left side of head: No preauricular or posterior auricular adenopathy.     Cervical: No cervical adenopathy.     Upper Body:      Right upper body: No supraclavicular or epitrochlear adenopathy.     Left upper body: No supraclavicular or epitrochlear adenopathy.  Skin:    General: Skin is warm and dry.     Findings: No erythema, lesion or rash.  Neurological:     General: No focal deficit present.     Mental Status: She is alert and oriented to person, place, and time.  Psychiatric:        Mood and Affect: Mood normal.        Behavior: Behavior normal.      Assessment and Plan:  Veronica Manning is a 23 y.o. female presenting to the [facility] Department for STI screening  1. Screening for venereal disease (  Primary) - Chlamydia/Gonorrhea [facility] - HIV [place] [facility] - Syphilis Serology, [place] [facility] - WET PREP FOR TRICH, YEAST, CLUE - Gonococcus culture  2. Itching in the vaginal area -Treated empirically for for vulvovaginal candidiasis. (See HPI above).  - clotrimazole -betamethasone (LOTRISONE) cream; Apply 1 Application topically daily.  Dispense: 50 g; Refill: 0   Patient accepted the following screenings: oral GC culture, vaginal CT/GC swab, vaginal wet prep, HIV, and RPR Patient meets criteria for HepB screening? No. Ordered? not applicable Patient meets criteria for HepC screening? No. Ordered? not applicable  Discussed time line for State Lab results and that patient will be called with positive results and encouraged patient to call if she had not heard in 2 weeks.  Counseled to return or seek care for continued or worsening symptoms Recommended repeat testing in 3 months with positive results. Recommended condom use with all sex for STI prevention.   Return PRN, for STI screening.  No future appointments.  Hardin GORMAN Pouch, NP

## 2024-06-22 NOTE — Progress Notes (Signed)
 Attestation of Attending Supervision of Advanced Practice Provider (CNM/NP/PA):  Evaluation, management, and procedures were performed by the Advanced Practice Provider under my supervision and collaboration.  I have reviewed the Advanced Practice Provider's note and chart, and I agree with the management and plan.  Dorothyann Helling, MD Clinical Services Medical Director [facility] Department

## 2024-06-26 LAB — GONOCOCCUS CULTURE

## 2024-08-23 ENCOUNTER — Ambulatory Visit: Payer: Self-pay | Admitting: Family Medicine

## 2024-08-23 ENCOUNTER — Ambulatory Visit: Payer: Self-pay

## 2024-08-23 DIAGNOSIS — Z113 Encounter for screening for infections with a predominantly sexual mode of transmission: Secondary | ICD-10-CM

## 2024-08-23 LAB — WET PREP FOR TRICH, YEAST, CLUE
Clue Cell Exam: NEGATIVE
Trichomonas Exam: NEGATIVE
Yeast Exam: NEGATIVE

## 2024-08-23 LAB — HM HIV SCREENING LAB: HM HIV Screening: NEGATIVE

## 2024-08-23 NOTE — Progress Notes (Cosign Needed Addendum)
 " [facility] Department STI clinic [address] [place] [address] Main phone: [phone]  STI screening visit  Subjective:  Veronica Manning is a 24 y.o. female being seen today for an STI screening visit. The patient reports they do have symptoms.    Patient has the following medical conditions:  Patient Active Problem List   Diagnosis Date Noted   Positive Chlamyida test 11/08/2023   Cervical cancer screening 11/08/2023   Chief Complaint  Patient presents with   SEXUALLY TRANSMITTED DISEASE    HPI Patient reports mild vaginal itching. No other symptoms. Last sex 1 month ago.  Reproductive considerations Patient reports they are not pregnant . They do not desire a pregnancy in the next year.They reported they are not interested in discussing contraception today.    Patient's last menstrual period was 08/14/2024 (exact date).  Does the patient using douching products? No  Patient's routine cervical screening is up to date and next due March, 2026.  See flowsheet for further details and programmatic requirements Hyperlink available at the top of the signed note in blue.  Flow sheet content below:  Pregnancy Intention Screening Does the patient want to become pregnant in the next year?: No Does the patient's partner want to become pregnant in the next year?: N/A Would the patient like to discuss contraceptive options today?: No Reason For STD Screen STD Screening: Has symptoms Have you ever had an STD?: Yes History of Antibiotic use in the past 2 weeks?: No STD Symptoms Denies all: No Genital Itching: Yes Lower abdominal pain: No Discharge: No Dysuria: No Genital ulcer / lesion: No Rash: No Vaginal irritation: No Oral / Other skin ulcer: No Pain with sex: No Sore Throat: No Visual Changes: No Vaginal Bleeding: No Risk Factors for Hep B Household, sexual, or needle sharing contact of a person infected with Hep B:  No Sexual contact with a person who uses drugs not as prescribed?: No Currently or Ever used drugs not as prescribed: No HIV Positive: No PRep Patient: No Men who have sex with men: No Have Hepatitis C: No History of Incarceration: No History of Homeslessness?: No Anal sex following anal drug use?: No Risk Factors for Hep C Currently using drugs not as prescribed: No Sexual partner(s) currently using drugs as not prescribed: No History of drug use: No HIV Positive: No People with a history of incarceration: No People born between the years of 29 and 31: No Counseling Patient counseled to use condoms with all sex: Condoms given RTC in 2-3 weeks for test results: Yes Clinic will call if test results abnormal before test result appt.: Yes Immunizations: Referred, Immunization history assessed Test results given to patient Patient counseled to use condoms with all sex: Condoms given   Screening for MPX risk:  Unexplained rash?  No   MSM?  No   Multiple or anonymous sex partners?  No   Any close or sexual contact with a person  diagnosed with MPX?  No   Any outside the [place]  where MPX is endemic?  No   High clinical suspicion for MPX?    -Unlikely to be chickenpox    -Lymphadenopathy    -Rash that presents in same phase of       evolution on any given body part  No   Does this patient meet CDC recommendations for vaccination against MPOX? No  You already have or anticipate having the following risks:  Your sex partner has the  following risks: You're traveling to a county with a clade I MPOX outbreak and anticipate these risks: Occupational exposure  You had known or suspected exposure to someone with monkeypox You had a sex partner in the past 2 weeks who was diagnosed with monkeypox You are a gay, bisexual, or other man who has sex with men, or are transgender or nonbinary and in the past 6 months have had any of the following: - A new diagnosis of one or more sexually  transmitted diseases (e.g., chlamydia, gonorrhea, or syphilis) - More than one sex partner You have had any of the following in the past 6 months: - Sex at a commercial sex venue (like a sex club or bathhouse) - Sex related to a large commercial event   or in a geographic area (city or county for example) where mpox virus transmission is occurring Sex with a new partner Sex at a commercial sex venue (e.g., a sex club or bathhouse) Sex in it consultant for money, goods, drugs, or other trade Sex in association with a large public event (e.g., a rave, party, or festival) i.e. certain people who work in a laboratory or healthcare facility   Infectious disease screenings: Vaccinated against HPV? Yes  HIV Ever had a positive? No Last test: 2025 Results in chart:  Lab Results  Component Value Date   HMHIVSCREEN Negative - Validated 06/22/2024   No results found for: HIV   Hep B Hep B status: negative on 2025 Received HBV vaccination? Yes Received HBV testing for immunity? Yes Results in chart:  No components found for: HMHEPBSCREEN  Do they qualify for HBV screening today? No Criteria:  -Household, sexual or needle sharing contact with HBV -History of drug use or homelessness -HIV positive -Those with known Hep C  Hep C Hep C status: negative on 2025 Results in chart:  Lab Results  Component Value Date   HMHEPCSCREEN Negative-Validated 01/04/2024   No components found for: HEPC  Do they qualify for HCV screening today? No Criteria - since the last HCV result, does the patient have any of the following? - Current drug use - Have a partner with drug use - Has been incarcerated  Immunization history:   There is no immunization history on file for this patient.  The following portions of the patient's history were reviewed and updated as appropriate: allergies, current medications, past medical history, past social history, past surgical history and problem list.  Substance  use screenings:  Uses tobacco products? No Uses vapes? Yes Uses alcohol? Yes, occasional Uses non-injectable substances that alter your mental status? No Uses non-prescribed injectable substances? No  Objective:  There were no vitals filed for this visit.  Physical Exam Vitals and nursing note reviewed. Exam conducted with a chaperone present Brett Orange).  Constitutional:      Appearance: Normal appearance.  HENT:     Head: Normocephalic and atraumatic.     Comments: No nits or hair loss on scalp, brows, and lashes    Mouth/Throat:     Mouth: Mucous membranes are moist.     Pharynx: Oropharynx is clear. No oropharyngeal exudate or posterior oropharyngeal erythema.  Eyes:     General:        Right eye: No discharge.        Left eye: No discharge.     Conjunctiva/sclera: Conjunctivae normal.     Right eye: Right conjunctiva is not injected.     Left eye: Left conjunctiva is not injected.  Pulmonary:  Effort: Pulmonary effort is normal.  Abdominal:     Tenderness: There is no abdominal tenderness. There is no rebound.  Genitourinary:    Pubic Area: No rash or pubic lice.      Labia:        Right: No rash, tenderness or lesion.        Left: No rash, tenderness or lesion.      Vagina: Normal.     Cervix: Normal.     Comments: pH=3 Lymphadenopathy:     Cervical: No cervical adenopathy.     Upper Body:     Right upper body: No supraclavicular adenopathy.     Left upper body: No supraclavicular adenopathy.  Skin:    General: Skin is warm and dry.     Findings: No lesion or rash.  Neurological:     Mental Status: She is alert and oriented to person, place, and time.  Psychiatric:        Mood and Affect: Mood normal.    Assessment and Plan:  Veronica Manning is a 24 y.o. female presenting to the [facility] Department for STI screening.  Patient accepted the following screenings: vaginal CT/GC swab, vaginal wet prep, HIV, and RPR  1. Screening  examination for sexually transmitted disease (Primary)  - Chlamydia/Gonorrhea [facility] - WET PREP FOR TRICH, YEAST, CLUE - HIV [place] - Syphilis Serology, [place]   Counseling: Discussed time line for [facility] results and that patient will be called with positive results and encouraged patient to call if they had not heard in 2 weeks.  Counseled to return or seek care for continued or worsening symptoms Recommended repeat testing in 3 months with positive results. Recommended condom use with all sex for STI prevention.   Return if symptoms worsen or fail to improve.  No future appointments.  Damien FORBES Satchel, NP "

## 2024-08-23 NOTE — Progress Notes (Signed)
 Pt is here for STD screening. Wet prep results reviewed  with patient and requires no treatment per SO. Opportunity given to patient to ask questions for any clarifications. Questions answered. Condoms given. Kwadwo Jamaul Heist,RN.
